# Patient Record
Sex: Male | Born: 1980 | Hispanic: No | Marital: Married | Smoking: Current some day smoker
Health system: Southern US, Community
[De-identification: ages and names within clinical notes are randomized; demographics above are authoritative.]

## PROBLEM LIST (undated history)

## (undated) DIAGNOSIS — S43005A Unspecified dislocation of left shoulder joint, initial encounter: Secondary | ICD-10-CM

## (undated) DIAGNOSIS — J45909 Unspecified asthma, uncomplicated: Secondary | ICD-10-CM

## (undated) DIAGNOSIS — J302 Other seasonal allergic rhinitis: Secondary | ICD-10-CM

## (undated) HISTORY — PX: NO PAST SURGERIES: SHX2092

## (undated) HISTORY — DX: Unspecified dislocation of left shoulder joint, initial encounter: S43.005A

---

## 2001-01-26 HISTORY — PX: WISDOM TOOTH EXTRACTION: SHX21

## 2014-06-19 ENCOUNTER — Encounter: Payer: Self-pay | Admitting: Emergency Medicine

## 2014-06-19 ENCOUNTER — Ambulatory Visit
Admission: EM | Admit: 2014-06-19 | Discharge: 2014-06-19 | Disposition: A | Discharge status: Home / Self Care | Payer: Managed Care, Other (non HMO) | Attending: Family Medicine | Admitting: Family Medicine

## 2014-06-19 DIAGNOSIS — J45901 Unspecified asthma with (acute) exacerbation: Secondary | ICD-10-CM | POA: Diagnosis not present

## 2014-06-19 HISTORY — DX: Unspecified asthma, uncomplicated: J45.909

## 2014-06-19 HISTORY — DX: Other seasonal allergic rhinitis: J30.2

## 2014-06-19 MED ORDER — BECLOMETHASONE DIPROPIONATE 80 MCG/ACT IN AERS: 1.0000 | INHALATION_SPRAY | Freq: Two times a day (BID) | RESPIRATORY_TRACT | Status: DC

## 2014-06-19 MED ORDER — ALBUTEROL SULFATE HFA 108 (90 BASE) MCG/ACT IN AERS: 1.0000 | INHALATION_SPRAY | Freq: Four times a day (QID) | RESPIRATORY_TRACT | Status: DC | PRN

## 2014-06-19 MED ORDER — GUAIFENESIN-CODEINE 100-10 MG/5ML PO SOLN: ORAL | Status: DC

## 2014-06-19 NOTE — Discharge Instructions (Signed)

## 2014-06-19 NOTE — ED Provider Notes (Signed)
CSN: [id number]     Arrival date & time 06/19/14  1929 History   First MD Initiated Contact with Patient 06/19/14 1931     Chief Complaint  Patient presents with  . Asthma   (Consider location/radiation/quality/duration/timing/severity/associated sxs/prior Treatment) HPI Comments: Patient with a h/o mild, intermittent asthma presents with 1 week h/o wheezing, shortness of breath, cough worse since pollen increased and allergies worse. States has been out of his albuterol inhaler since January. Recently moved here and has not established care with a new PCP.   Patient is a 34 y.o. male presenting with asthma. The history is provided by the patient.  Asthma This is a chronic (h/o asthma; worse recently due to pollen) problem. The current episode started more than 1 week ago. The problem occurs constantly. The problem has not changed since onset.   Past Medical History  Diagnosis Date  . Asthma   . Seasonal allergies    No past surgical history on file. No family history on file. History  Substance Use Topics  . Smoking status: Current Some Day Smoker    Types: Cigars  . Smokeless tobacco: Never Used  . Alcohol Use: 1.2 oz/week    2 Cans of beer per week    Review of Systems  Allergies  Review of patient's allergies indicates no known allergies.  Home Medications   Prior to Admission medications   Medication Sig Start Date End Date Taking? Authorizing Provider  albuterol (PROVENTIL HFA;VENTOLIN HFA) 108 (90 BASE) MCG/ACT inhaler Inhale 1-2 puffs into the lungs every 6 (six) hours as needed for wheezing or shortness of breath. 06/19/14   Payton Mccallumrlando Shylin Keizer, MD  beclomethasone (QVAR) 80 MCG/ACT inhaler Inhale 1 puff into the lungs 2 (two) times daily. 06/19/14   Payton Mccallumrlando Nalaya Wojdyla, MD  guaiFENesin-codeine 100-10 MG/5ML syrup Take 10ml po qhs prn cough 06/19/14   Payton Mccallumrlando Kaiea Esselman, MD   BP 131/75 mmHg  Pulse 75  Temp(Src) 98.4 F (36.9 C) (Oral)  Resp 18  Ht 5\' 11"  (1.803 m)  Wt 250 lb  (113.399 kg)  BMI 34.88 kg/m2  SpO2 99% Physical Exam  Constitutional: He appears well-developed and well-nourished. No distress.  HENT:  Head: Normocephalic and atraumatic.  Right Ear: Tympanic membrane, external ear and ear canal normal.  Left Ear: Tympanic membrane, external ear and ear canal normal.  Nose: Nose normal.  Mouth/Throat: Uvula is midline, oropharynx is clear and moist and mucous membranes are normal. No oropharyngeal exudate or tonsillar abscesses.  Eyes: Conjunctivae and EOM are normal. Pupils are equal, round, and reactive to light. Right eye exhibits no discharge. Left eye exhibits no discharge. No scleral icterus.  Neck: Normal range of motion. Neck supple. No tracheal deviation present. No thyromegaly present.  Cardiovascular: Normal rate, regular rhythm and normal heart sounds.   Pulmonary/Chest: Effort normal. No stridor. No respiratory distress. He has wheezes (mild bilateral expiratory wheezes). He has no rales. He exhibits no tenderness.  Lymphadenopathy:    He has no cervical adenopathy.  Neurological: He is alert.  Skin: Skin is warm and dry. No rash noted. He is not diaphoretic.  Nursing note and vitals reviewed.   ED Course  Procedures (including critical care time) Labs Review Labs Reviewed - No data to display  Imaging Review No results found.   MDM   1. Asthma exacerbation   (mild)  Plan: 1. Diagnosis reviewed with patient 2. rx as per orders; risks, benefits, potential side effects reviewed with patient;refilled patient's albuterol inhaler and added/start steroid  inhaler as well. 3. Recommend supportive treatment with otc allergy medications 4. Establish care with PCP 5. F/u prn if symptoms worsen or don't improve    Payton Mccallum, MD 06/19/14 2030

## 2014-06-19 NOTE — ED Notes (Addendum)
Feels the pollen and other allergens are flaring up his asthma. About a week ago started with productive cough, runny nose, vomiting. Has three children at home- one of his boys is sick. Having wheezing and SOB. Subjective fevers a few days ago, but reports that has resolved. Having a pain in his chest as well. Reports he felt like his heart skip a beat. Has a history of acid reflux.

## 2014-07-13 ENCOUNTER — Encounter: Payer: Self-pay | Admitting: Internal Medicine

## 2014-07-13 DIAGNOSIS — F17200 Nicotine dependence, unspecified, uncomplicated: Secondary | ICD-10-CM | POA: Insufficient documentation

## 2014-07-13 DIAGNOSIS — J4530 Mild persistent asthma, uncomplicated: Secondary | ICD-10-CM | POA: Insufficient documentation

## 2014-07-16 ENCOUNTER — Ambulatory Visit: Payer: Self-pay | Admitting: Internal Medicine

## 2014-08-14 ENCOUNTER — Ambulatory Visit: Payer: Self-pay | Admitting: Internal Medicine

## 2017-04-15 ENCOUNTER — Other Ambulatory Visit: Payer: Self-pay

## 2017-04-15 ENCOUNTER — Ambulatory Visit
Admission: EM | Admit: 2017-04-15 | Discharge: 2017-04-15 | Disposition: A | Discharge status: Home / Self Care | Payer: Managed Care, Other (non HMO) | Attending: Family Medicine | Admitting: Family Medicine

## 2017-04-15 DIAGNOSIS — H6692 Otitis media, unspecified, left ear: Secondary | ICD-10-CM | POA: Diagnosis not present

## 2017-04-15 DIAGNOSIS — J988 Other specified respiratory disorders: Secondary | ICD-10-CM

## 2017-04-15 LAB — RAPID STREP SCREEN (MED CTR MEBANE ONLY): Streptococcus, Group A Screen (Direct): NEGATIVE

## 2017-04-15 MED ORDER — GUAIFENESIN-CODEINE 100-10 MG/5ML PO SOLN: ORAL | 0 refills | Status: DC

## 2017-04-15 MED ORDER — AMOXICILLIN-POT CLAVULANATE 875-125 MG PO TABS: 1.0000 | ORAL_TABLET | Freq: Two times a day (BID) | ORAL | 0 refills | Status: DC

## 2017-04-15 MED ORDER — BECLOMETHASONE DIPROPIONATE 80 MCG/ACT IN AERS: 1.0000 | INHALATION_SPRAY | Freq: Two times a day (BID) | RESPIRATORY_TRACT | 1 refills | Status: DC

## 2017-04-15 NOTE — Discharge Instructions (Signed)
Medications as prescribed. ° °Take care ° °Dr. Yuriel Lopezmartinez  °

## 2017-04-15 NOTE — ED Provider Notes (Signed)
[facility]    CSN: [id number] Arrival date & time: 04/15/17  1229   History   Chief Complaint Chief Complaint  Patient presents with  . Sore Throat   HPI  37 year old male presents with upper respiratory symptoms.  Patient reports that he is been sick since Tuesday.  He states he has had sore throat, congestion, ear pain/pressure, cough, wheezing.  He is recently had a sick contact as he took care of his daughter.  He is out of his controller medication for his asthma.  He is been using his albuterol sparingly.  No fever.  No known exacerbating relieving factors.  No other reported symptoms.  No other complaints at this time.  Past Medical History:  Diagnosis Date  . Asthma   . Seasonal allergies    Patient Active Problem List   Diagnosis Date Noted  . Mild persistent asthma in adult without complication 07/13/2014  . Tobacco use disorder 07/13/2014    Past Surgical History:  Procedure Laterality Date  . NO PAST SURGERIES      Home Medications    Prior to Admission medications   Medication Sig Start Date End Date Taking? Authorizing Provider  albuterol (PROVENTIL HFA;VENTOLIN HFA) 108 (90 BASE) MCG/ACT inhaler Inhale 1-2 puffs into the lungs every 6 (six) hours as needed for wheezing or shortness of breath. 06/19/14  Yes Payton Mccallumonty, [place], MD  amoxicillin-clavulanate (AUGMENTIN) 875-125 MG tablet Take 1 tablet by mouth every 12 (twelve) hours. 04/15/17   Tommie Samsook, Fed Ceci G, DO  beclomethasone (QVAR) 80 MCG/ACT inhaler Inhale 1 puff into the lungs 2 (two) times daily. 04/15/17   Tommie Samsook, Tanaysia Bhardwaj G, DO  guaiFENesin-codeine 100-10 MG/5ML syrup Take 10ml po qhs prn cough 04/15/17   Tommie Samsook, Travaris Kosh G, DO    Family History Family History  Problem Relation Age of Onset  . Diabetes Mother   . Diabetes Father     Social History Social History   Tobacco Use  . Smoking status: Current Some Day Smoker    Types: Cigars  . Smokeless tobacco: Never Used  Substance Use Topics  .  Alcohol use: Yes    Alcohol/week: 1.2 oz    Types: 2 Cans of beer per week  . Drug use: No   Allergies   Patient has no known allergies.   Review of Systems Review of Systems  Constitutional: Negative for fever.  HENT: Positive for congestion, ear pain and sore throat.   Respiratory: Positive for cough.    Physical Exam Triage Vital Signs ED Triage Vitals  Enc Vitals Group     BP 04/15/17 1244 (!) 161/93     Pulse Rate 04/15/17 1244 87     Resp 04/15/17 1244 18     Temp 04/15/17 1244 99.2 F (37.3 C)     Temp Source 04/15/17 1244 Oral     SpO2 04/15/17 1244 97 %     Weight 04/15/17 1245 250 lb (113.4 kg)     Height 04/15/17 1245 5\' 11"  (1.803 m)     Head Circumference --      Peak Flow --      Pain Score 04/15/17 1243 8     Pain Loc --      Pain Edu? --      Excl. in GC? --    Updated Vital Signs BP (!) 161/93 (BP Location: Left Arm)   Pulse 87   Temp 99.2 F (37.3 C) (Oral)   Resp 18   Ht 5\' 11"  (1.803  m)   Wt 250 lb (113.4 kg)   SpO2 97%   BMI 34.87 kg/m    Physical Exam  Constitutional: He is oriented to person, place, and time. He appears well-developed. No distress.  HENT:  Head: Normocephalic and atraumatic.  Oropharynx with moderate erythema. Left tonsillar swelling. No exudate.  Left ear - moderate erythema.  Eyes: Conjunctivae are normal. Right eye exhibits no discharge. Left eye exhibits no discharge.  Cardiovascular: Normal rate and regular rhythm.  Pulmonary/Chest: Effort normal. He has wheezes.  Neurological: He is alert and oriented to person, place, and time.  Psychiatric: He has a normal mood and affect. His behavior is normal.  Nursing note and vitals reviewed.  UC Treatments / Results  Labs (all labs ordered are listed, but only abnormal results are displayed) Labs Reviewed  RAPID STREP SCREEN (NOT AT [facility])  CULTURE, GROUP A STREP [facility])    EKG  EKG Interpretation None       Radiology No results  found.  Procedures Procedures (including critical care time)  Medications Ordered in UC Medications - No data to display   Initial Impression / Assessment and Plan / UC Course  I have reviewed the triage vital signs and the nursing notes.  Pertinent labs & imaging results that were available during my care of the patient were reviewed by me and considered in my medical decision making (see chart for details).    37 year old male presents with a respiratory infection.  Found to have otitis media.  Treating with Augmentin.  Refilled Qvar.  Guaifenesin with codeine for cough.  Final Clinical Impressions(s) / UC Diagnoses   Final diagnoses:  Respiratory infection  Left otitis media, unspecified otitis media type    ED Discharge Orders        Ordered    guaiFENesin-codeine 100-10 MG/5ML syrup     04/15/17 1314    beclomethasone (QVAR) 80 MCG/ACT inhaler  2 times daily     04/15/17 1314    amoxicillin-clavulanate (AUGMENTIN) 875-125 MG tablet  Every 12 hours     04/15/17 1314     Controlled Substance Prescriptions [place] Controlled Substance Registry consulted? Not Applicable   Tommie Sams, DO 04/15/17 1335

## 2017-04-15 NOTE — ED Triage Notes (Signed)
Patient complains of sore throat that started Monday evening. Patient states that when he woke up on Tuesday symptoms were worse. Patient states that he has been noticing nasal congestion. Patient reports history of asthma. Patient states that he has been using inhaler sparingly. Left ear pain and headache on Wednesday and felt a pop in his ear.

## 2017-04-18 LAB — CULTURE, GROUP A STREP (THRC)

## 2019-01-27 HISTORY — PX: WISDOM TOOTH EXTRACTION: SHX21

## 2020-01-04 ENCOUNTER — Ambulatory Visit (INDEPENDENT_AMBULATORY_CARE_PROVIDER_SITE_OTHER): Payer: 59

## 2020-01-04 ENCOUNTER — Ambulatory Visit
Admission: EM | Admit: 2020-01-04 | Discharge: 2020-01-04 | Disposition: A | Discharge status: Home / Self Care | Payer: 59 | Attending: Sports Medicine | Admitting: Sports Medicine

## 2020-01-04 ENCOUNTER — Other Ambulatory Visit: Payer: Self-pay

## 2020-01-04 DIAGNOSIS — M25512 Pain in left shoulder: Secondary | ICD-10-CM

## 2020-01-04 DIAGNOSIS — M62838 Other muscle spasm: Secondary | ICD-10-CM | POA: Diagnosis not present

## 2020-01-04 DIAGNOSIS — S161XXA Strain of muscle, fascia and tendon at neck level, initial encounter: Secondary | ICD-10-CM

## 2020-01-04 DIAGNOSIS — M542 Cervicalgia: Secondary | ICD-10-CM | POA: Diagnosis not present

## 2020-01-04 DIAGNOSIS — X500XXA Overexertion from strenuous movement or load, initial encounter: Secondary | ICD-10-CM

## 2020-01-04 MED ORDER — CYCLOBENZAPRINE HCL 10 MG PO TABS: 10.0000 mg | ORAL_TABLET | Freq: Two times a day (BID) | ORAL | 0 refills | Status: AC | PRN

## 2020-01-04 NOTE — Discharge Instructions (Signed)
X-rays are wnl.  Take flexeril as directly as needed.  Do not operate a motor vehicle or heavy equipment when taking flexeril.  Provided a rx for PT - please call to make an appointment.  Consider chiropractic care.

## 2020-01-04 NOTE — ED Provider Notes (Signed)
[facility]    CSN: [id number] Arrival date & time: 01/04/20  1636      History   Chief Complaint Chief Complaint  Patient presents with  . Neck Pain    HPI Hector Mercer is a 39 y.o. male.   39 yo male who presents for evaluation of left sided posterior neck pain that radiates to the left scapula and the left shoulder. He reports twisting awkwardly while work on 01/02/20 and it occurred around 11pm.  He did not work yesterday or today due to pain, decreased ROM and spasm.  Some parasthesia down the left arm which has improved.  No red flag signs or symptoms.     Past Medical History:  Diagnosis Date  . Asthma   . Seasonal allergies     Patient Active Problem List   Diagnosis Date Noted  . Mild persistent asthma in adult without complication 07/13/2014  . Tobacco use disorder 07/13/2014    Past Surgical History:  Procedure Laterality Date  . NO PAST SURGERIES         Home Medications    Prior to Admission medications   Medication Sig Start Date End Date Taking? Authorizing Provider  albuterol (PROVENTIL HFA;VENTOLIN HFA) 108 (90 BASE) MCG/ACT inhaler Inhale 1-2 puffs into the lungs every 6 (six) hours as needed for wheezing or shortness of breath. 06/19/14   Payton Mccallum, MD  amoxicillin-clavulanate (AUGMENTIN) 875-125 MG tablet Take 1 tablet by mouth every 12 (twelve) hours. 04/15/17   Tommie Sams, DO  beclomethasone (QVAR) 80 MCG/ACT inhaler Inhale 1 puff into the lungs 2 (two) times daily. 04/15/17   Tommie Sams, DO  cyclobenzaprine (FLEXERIL) 10 MG tablet Take 1 tablet (10 mg total) by mouth 2 (two) times daily as needed for up to 10 days for muscle spasms. 01/04/20 01/14/20  Delton See, MD  guaiFENesin-codeine 100-10 MG/5ML syrup Take 77ml po qhs prn cough 04/15/17   Tommie Sams, DO    Family History Family History  Problem Relation Age of Onset  . Diabetes Mother   . Diabetes Father     Social History Social History   Tobacco  Use  . Smoking status: Current Some Day Smoker    Types: Cigars  . Smokeless tobacco: Never Used  Vaping Use  . Vaping Use: Every day  Substance Use Topics  . Alcohol use: Yes    Alcohol/week: 2.0 standard drinks    Types: 2 Cans of beer per week  . Drug use: No     Allergies   Patient has no known allergies.   Review of Systems Review of Systems  Musculoskeletal: Positive for neck pain and neck stiffness.  All other systems reviewed and are negative.    Physical Exam Triage Vital Signs ED Triage Vitals  Enc Vitals Group     BP 01/04/20 1718 (!) 144/85     Pulse Rate 01/04/20 1718 75     Resp 01/04/20 1718 18     Temp 01/04/20 1718 98.3 F (36.8 C)     Temp Source 01/04/20 1718 Oral     SpO2 01/04/20 1718 97 %     Weight 01/04/20 1716 225 lb (102.1 kg)     Height 01/04/20 1716 5\' 11"  (1.803 m)     Head Circumference --      Peak Flow --      Pain Score 01/04/20 1711 7     Pain Loc --      Pain Edu? --  Excl. in GC? --    No data found.  Updated Vital Signs BP (!) 144/85 (BP Location: Right Arm)   Pulse 75   Temp 98.3 F (36.8 C) (Oral)   Resp 18   Ht 5\' 11"  (1.803 m)   Wt 102.1 kg   SpO2 97%   BMI 31.38 kg/m   Visual Acuity Right Eye Distance:   Left Eye Distance:   Bilateral Distance:    Right Eye Near:   Left Eye Near:    Bilateral Near:     Physical Exam Vitals and nursing note reviewed.  Constitutional:      Appearance: Normal appearance. He is not ill-appearing or toxic-appearing.  HENT:     Head: Normocephalic and atraumatic.  Neck:     Comments: Decreased ROM in all planes of the neck.  TTP in left trap and into the medial scapular border.  No focal strength deficits.  No weakness.  2+ DTR's.  B/L shoulder exam wnl. Musculoskeletal:        General: Tenderness and signs of injury present.     Cervical back: Rigidity and tenderness present.  Skin:    General: Skin is warm and dry.  Neurological:     General: No focal deficit  present.     Mental Status: He is alert and oriented to person, place, and time.     Motor: No weakness.      UC Treatments / Results  Labs (all labs ordered are listed, but only abnormal results are displayed) Labs Reviewed - No data to display  EKG   Radiology DG Cervical Spine Complete  Result Date: 01/04/2020 CLINICAL DATA:  Neck pain after lifting a heavy palate. Pain radiates to the left shoulder. EXAM: CERVICAL SPINE - COMPLETE 4+ VIEW COMPARISON:  None. FINDINGS: There is no evidence of cervical spine fracture or prevertebral soft tissue swelling. Alignment is normal. No other significant bone abnormalities are identified. IMPRESSION: Negative cervical spine radiographs. Electronically Signed   By: 14/09/2019 M.D.   On: 01/04/2020 18:12    Procedures Procedures (including critical care time)  Medications Ordered in UC Medications - No data to display  Initial Impression / Assessment and Plan / UC Course  I have reviewed the triage vital signs and the nursing notes.  Pertinent labs & imaging results that were available during my care of the patient were reviewed by me and considered in my medical decision making (see chart for details).      Final Clinical Impressions(s) / UC Diagnoses   Final diagnoses:  Neck pain  Strain of neck muscle, initial encounter  Muscle spasms of neck  Trapezius muscle spasm     Discharge Instructions     X-rays are wnl.  Take flexeril as directly as needed.  Do not operate a motor vehicle or heavy equipment when taking flexeril.  Provided a rx for PT - please call to make an appointment.  Consider chiropractic care.    ED Prescriptions    Medication Sig Dispense Auth. Provider   cyclobenzaprine (FLEXERIL) 10 MG tablet Take 1 tablet (10 mg total) by mouth 2 (two) times daily as needed for up to 10 days for muscle spasms. 20 tablet 14/09/2019, MD     PDMP not reviewed this encounter.   Delton See,  MD 01/04/20 14/09/21

## 2020-01-04 NOTE — ED Triage Notes (Signed)
Patient complains of neck pain that started Tuesday evening at 11pm. Patient states that this occurred while working. 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 States that he has limited range of motion in his neck and that pain radiates in to his left shoulder blade.

## 2021-01-31 ENCOUNTER — Ambulatory Visit
Admission: EM | Admit: 2021-01-31 | Discharge: 2021-01-31 | Disposition: A | Discharge status: Other Acute Inpatient Hospital | Payer: 59 | Attending: Physician Assistant | Admitting: Physician Assistant

## 2021-01-31 ENCOUNTER — Ambulatory Visit (INDEPENDENT_AMBULATORY_CARE_PROVIDER_SITE_OTHER): Payer: 59

## 2021-01-31 ENCOUNTER — Encounter: Payer: Self-pay | Admitting: Emergency Medicine

## 2021-01-31 ENCOUNTER — Other Ambulatory Visit: Payer: Self-pay

## 2021-01-31 DIAGNOSIS — R079 Chest pain, unspecified: Secondary | ICD-10-CM

## 2021-01-31 DIAGNOSIS — R229 Localized swelling, mass and lump, unspecified: Secondary | ICD-10-CM

## 2021-01-31 DIAGNOSIS — R0789 Other chest pain: Secondary | ICD-10-CM

## 2021-01-31 MED ORDER — ASPIRIN 81 MG PO CHEW
324.0000 mg | CHEWABLE_TABLET | Freq: Once | ORAL | Status: AC
  Administered 2021-01-31: 324 mg via ORAL

## 2021-01-31 NOTE — ED Provider Notes (Signed)
[facility]    CSN: [id number] Arrival date & time: 01/31/21  0934      History   Chief Complaint Chief Complaint  Patient presents with   Rib Pain    Left side    HPI Hector Mercer is a 41 y.o. male.   HPI  Chest Pain: Patient reports that he has had left-sided rib pain off and on for about 2 weeks.  Usually is occurring with activity or stress.  He states that he has been more stressed recently than normal with a new job, home stress, etc. 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  He states that he took an aspirin and sat down and rested and the symptoms resolved within a few minutes.  He denied any shortness of breath, peripheral edema, fever or cough. No chest injury.  He states that today when he was discussing symptoms with his boss his symptoms returned about an hour ago. He reports that the pain feels sharp like and occurs almost in throbbing sensation. He does report a mass of his chest that he has noticed for the past few weeks which is right over where the pain in his chest is. This area is tender but does not necessarily reproduce his chest pain.  In terms of MI risk he denies any family history of early cardiac death or MI, he is a tobacco user.   Past Medical History:  Diagnosis Date   Asthma    Seasonal allergies     Patient Active Problem List   Diagnosis Date Noted   Mild persistent asthma in adult without complication 07/13/2014   Tobacco use disorder 07/13/2014    Past Surgical History:  Procedure Laterality Date   NO PAST SURGERIES         Home Medications    Prior to Admission medications   Medication Sig Start Date End Date Taking? Authorizing Provider  albuterol (PROVENTIL HFA;VENTOLIN HFA) 108 (90 BASE) MCG/ACT inhaler Inhale 1-2 puffs into the lungs every 6 (six) hours as needed for wheezing or shortness of breath. 06/19/14   Payton Mccallumonty,  [place], MD  amoxicillin-clavulanate (AUGMENTIN) 875-125 MG tablet Take 1 tablet by mouth every 12 (twelve) hours. 04/15/17   Tommie Samsook, Jayce G, DO  beclomethasone (QVAR) 80 MCG/ACT inhaler Inhale 1 puff into the lungs 2 (two) times daily. 04/15/17   Tommie Samsook, Jayce G, DO  guaiFENesin-codeine 100-10 MG/5ML syrup Take 10ml po qhs prn cough 04/15/17   Tommie Samsook, Jayce G, DO    Family History Family History  Problem Relation Age of Onset   Diabetes Mother    Diabetes Father     Social History Social History   Tobacco Use   Smoking status: Some Days    Types: Cigars   Smokeless tobacco: Never  Vaping Use   Vaping Use: Every day  Substance Use Topics   Alcohol use: Yes    Alcohol/week: 2.0 standard drinks    Types: 2 Cans of beer per week   Drug use: No     Allergies   Patient has no known allergies.   Review of Systems Review of Systems  As stated above in HPI Physical Exam Triage Vital Signs ED Triage Vitals  Enc Vitals Group     BP 01/31/21 0949 (!) 150/94     Pulse Rate 01/31/21 0949 93     Resp 01/31/21 0949 15  Temp 01/31/21 0949 98.4 F (36.9 C)     Temp Source 01/31/21 0949 Oral     SpO2 01/31/21 0949 100 %     Weight 01/31/21 0946 225 lb 1.4 oz (102.1 kg)     Height 01/31/21 0946 5\' 11"  (1.803 m)     Head Circumference --      Peak Flow --      Pain Score 01/31/21 0946 6     Pain Loc --      Pain Edu? --      Excl. in GC? --    No data found.  Updated Vital Signs BP (!) 150/94 (BP Location: Left Arm)    Pulse 93    Temp 98.4 F (36.9 C) (Oral)    Resp 15    Ht 5\' 11"  (1.803 m)    Wt 225 lb 1.4 oz (102.1 kg)    SpO2 100%    BMI 31.39 kg/m   Physical Exam Vitals and nursing note reviewed.  Constitutional:      General: He is not in acute distress.    Appearance: Normal appearance. He is not ill-appearing, toxic-appearing or diaphoretic.  HENT:     Head: Normocephalic and atraumatic.  Eyes:     Extraocular Movements: Extraocular movements intact.      Conjunctiva/sclera: Conjunctivae normal.     Pupils: Pupils are equal, round, and reactive to light.  Neck:     Vascular: No carotid bruit.  Cardiovascular:     Rate and Rhythm: Normal rate and regular rhythm.     Heart sounds: Normal heart sounds.     Comments: There is a smooth mobile oval soft 1 inch long mass of the left lower chest near PMI. No reproducible tenderness of chest to palpation.  Pulmonary:     Effort: Pulmonary effort is normal.     Breath sounds: Normal breath sounds.  Skin:    General: Skin is warm.     Capillary Refill: Capillary refill takes less than 2 seconds.     Coloration: Skin is not pale.     Findings: No bruising or rash.  Neurological:     Mental Status: He is alert and oriented to person, place, and time.     UC Treatments / Results  Labs (all labs ordered are listed, but only abnormal results are displayed) Labs Reviewed - No data to display  EKG   Radiology No results found.  Procedures Procedures (including critical care time)  Medications Ordered in UC Medications - No data to display  Initial Impression / Assessment and Plan / UC Course  I have reviewed the triage vital signs and the nursing notes.  Pertinent labs & imaging results that were available during my care of the patient were reviewed by me and considered in my medical decision making (see chart for details).     New. Has occurred off and on for 2 weeks. EKG shows a RBB which is new. Suggests a possible inferior MI however on review this is questionable. For now we have decided to administer ASA since he has not had any this morning and obtain a quick chest x ray. If cardiac etiology still suspected after this his wife will transport him to the ER per patient request. He appears stable with stable vitals and 6/10 pain.  Final Clinical Impressions(s) / UC Diagnoses   Final diagnoses:  None   Discharge Instructions   None    ED Prescriptions   None  PDMP not  reviewed this encounter.   Rushie Chestnut, [place] 01/31/21 1048

## 2021-01-31 NOTE — Discharge Instructions (Addendum)
Please contact general surgery to evaluate your skin mass after your ER visit

## 2021-01-31 NOTE — ED Triage Notes (Signed)
Patient states that yesterday he was yelling at his son and developed pain on the left side of his rib cage.  Patient denies SOB at this time.  Patient denies pain this morning but when he told his boss at work about what happened his boos told him to come here to get checked out.  Patient denies any cold symptoms.

## 2021-04-04 ENCOUNTER — Other Ambulatory Visit: Payer: Self-pay

## 2021-04-04 ENCOUNTER — Encounter: Payer: Self-pay | Admitting: Family Medicine

## 2021-04-04 ENCOUNTER — Ambulatory Visit (INDEPENDENT_AMBULATORY_CARE_PROVIDER_SITE_OTHER): Payer: BC Managed Care – PPO | Admitting: Family Medicine

## 2021-04-04 VITALS — BP 118/78 | HR 84 | Ht 71.0 in | Wt 233.0 lb

## 2021-04-04 DIAGNOSIS — R079 Chest pain, unspecified: Secondary | ICD-10-CM | POA: Diagnosis not present

## 2021-04-04 DIAGNOSIS — R0789 Other chest pain: Secondary | ICD-10-CM | POA: Diagnosis not present

## 2021-04-04 HISTORY — DX: Other chest pain: R07.89

## 2021-04-04 MED ORDER — HYDROXYZINE PAMOATE 25 MG PO CAPS: 25.0000 mg | ORAL_CAPSULE | Freq: Three times a day (TID) | ORAL | 0 refills | Status: AC | PRN

## 2021-04-04 NOTE — Progress Notes (Signed)
?  ? ?Primary Care / Sports Medicine Office Visit ? ?Patient Information:  ?Patient ID: Hector Mercer, male DOB: [birth year] Age: 41 y.o. MRN: [id number]  ? ?Hector Mercer is a pleasant 41 y.o. male presenting with the following: ? ?Chief Complaint  ?Patient presents with  ? New Patient (Initial Visit)  ? Establish Care  ? Chest Pain  ?  left-sided rib pain off and on since end 12/2020 / early 01/2021.  Usually is occurring with activity or stress/anxiety.  He states that he has been more stressed recently than normal with a new job, home stress, etc..Saw urgent care, advised follow-up.  ? ? ?Vitals:  ? 04/04/21 1503  ?BP: 118/78  ?Pulse: 84  ?SpO2: 97%  ? ?Vitals:  ? 04/04/21 1503  ?Weight: 233 lb (105.7 kg)  ?Height: 5\' 11"  (1.803 m)  ? ?Body mass index is 32.5 kg/m?. ? ?No results found.  ? ?Independent interpretation of notes and tests performed by another provider:  ? ?Independent interpretation of chest x-ray dated 01/31/2021 reveals clear lung fields bilaterally, normal cardiac silhouette, no acute cardiopulmonary process noted ? ?Procedures performed:  ? ?None ? ?Pertinent History, Exam, Impression, and Recommendations:  ? ?Intermittent left-sided chest pain ?41 year old patient presenting with 2-55-month history of intermittent left-sided chest pain, noted with physical exertion and during stressful states.  He denies any similar symptomatology in the past.  He does endorse increased life/work stress as he has restarted a physically demanding job lately.  He did go to the urgent care with this issue on 01/31/2021 where EKG and chest x-ray obtained.  Chest x-ray normal, EKG read as sinus arrhythmia with questionable right bundle branch block.  He does have a family history of maternal hypertension/heart failure. ? ?Examination today reveals positive S1 and S2, regular rate and rhythm, no additional heart sounds, no JVD, no carotid bruits, symmetric pulses, no peripheral edema.  From pulmonary standpoint, clear  lung fields throughout with initial coarse breath sounds noted in the left lower field that cleared after repetitive coughing.  Nontender about the sternum and parasternal regions, he does have a well-circumscribed left anterior inferior costal mass most consistent with a ganglion, this area is mildly tender but he states that this does not recreate his stated symptomatology.  An EKG was obtained today revealing apparent right bundle branch block in V1 and V2. ? ?I have discussed the varying etiologies behind his stated symptoms including cardiac, pulmonary, gastrointestinal, psychological, and musculoskeletal.  At this stage I have placed a referral to cardiology for further evaluation including possible echocardiogram and Holter monitor, I will obtain risk stratification labs, he can trial ibuprofen for symptoms to assess response from a musculoskeletal standpoint, additionally as needed hydroxyzine to assess anxiety component.  Plan for close follow-up after the patient has seen cardiology for next steps, he was advised to contact 03/31/2021 or seek emergent evaluation for recurrent symptomatology. ? ?Acute issue, systemic findings, tenderness, Rx management, independent interpretation radiography  ? ?Orders & Medications ?Meds ordered this encounter  ?Medications  ? hydrOXYzine (VISTARIL) 25 MG capsule  ?  Sig: Take 1 capsule (25 mg total) by mouth every 8 (eight) hours as needed for anxiety.  ?  Dispense:  30 capsule  ?  Refill:  0  ? ?Orders Placed This Encounter  ?Procedures  ? Apo A1 + B + Ratio  ? CBC  ? Comprehensive metabolic panel  ? Lipid panel  ? TSH  ? Ambulatory referral to Cardiology  ? EKG 12-Lead  ?  ? ?  Return in about 2 months (around 06/04/2021).  ?  ? ?Jerrol Banana, MD ? ? Primary Care Sports Medicine ?[facility] ?[facility]  ? ?

## 2021-04-04 NOTE — Assessment & Plan Note (Signed)
41 year old patient presenting with 2-51-month history of intermittent left-sided chest pain, noted with physical exertion and during stressful states.  He denies any similar symptomatology in the past.  He does endorse increased life/work stress as he has restarted a physically demanding job lately.  He did go to the urgent care with this issue on 01/31/2021 where EKG and chest x-ray obtained.  Chest x-ray normal, EKG read as sinus arrhythmia with questionable right bundle branch block.  He does have a family history of maternal hypertension/heart failure. ? ?Examination today reveals positive S1 and S2, regular rate and rhythm, no additional heart sounds, no JVD, no carotid bruits, symmetric pulses, no peripheral edema.  From pulmonary standpoint, clear lung fields throughout with initial coarse breath sounds noted in the left lower field that cleared after repetitive coughing.  Nontender about the sternum and parasternal regions, he does have a well-circumscribed left anterior inferior costal mass most consistent with a ganglion, this area is mildly tender but he states that this does not recreate his stated symptomatology.  An EKG was obtained today revealing apparent right bundle branch block in V1 and V2. ? ?I have discussed the varying etiologies behind his stated symptoms including cardiac, pulmonary, gastrointestinal, psychological, and musculoskeletal.  At this stage I have placed a referral to cardiology for further evaluation including possible echocardiogram and Holter monitor, I will obtain risk stratification labs, he can trial ibuprofen for symptoms to assess response from a musculoskeletal standpoint, additionally as needed hydroxyzine to assess anxiety component.  Plan for close follow-up after the patient has seen cardiology for next steps, he was advised to contact us or seek emergent evaluation for recurrent symptomatology. ? ?Acute issue, systemic findings, tenderness, Rx management, independent  interpretation radiography ?

## 2021-04-04 NOTE — Patient Instructions (Addendum)
-   Obtain fasting labs with orders provided (can have water or black coffee but otherwise no food or drink x 8 hours before labs) ?- Review information provided ?-Referral coordinator will contact you to schedule follow-up with cardiology ?- Can trial hydroxyzine if chest pain occurs, additionally ibuprofen, make note if beneficial ?- If symptoms recur, seek emergent medical evaluation ?- Return for follow-up in 2 months ?

## 2021-05-23 ENCOUNTER — Encounter: Payer: Self-pay | Admitting: Cardiology

## 2021-05-23 ENCOUNTER — Ambulatory Visit (INDEPENDENT_AMBULATORY_CARE_PROVIDER_SITE_OTHER): Payer: BC Managed Care – PPO | Admitting: Cardiology

## 2021-05-23 VITALS — BP 132/80 | HR 113 | Ht 71.5 in | Wt 234.0 lb

## 2021-05-23 DIAGNOSIS — F17200 Nicotine dependence, unspecified, uncomplicated: Secondary | ICD-10-CM

## 2021-05-23 DIAGNOSIS — R072 Precordial pain: Secondary | ICD-10-CM | POA: Diagnosis not present

## 2021-05-23 NOTE — Patient Instructions (Signed)
Medication Instructions:  ? ?Your physician recommends that you continue on your current medications as directed. Please refer to the Current Medication list given to you today. ? ?*If you need a refill on your cardiac medications before your next appointment, please call your pharmacy* ? ? ?Lab Work: ? ?None ordered ? ?If you have labs (blood work) drawn today and your tests are completely normal, you will receive your results only by: ?MyChart Message (if you have MyChart) OR ?A paper copy in the mail ?If you have any lab test that is abnormal or we need to change your treatment, we will call you to review the results. ? ? ?Testing/Procedures: ? ?Your physician has requested that you have an echocardiogram. Echocardiography is a painless test that uses sound waves to create images of your heart. It provides your doctor with information about the size and shape of your heart and how well your heart?s chambers and valves are working. This procedure takes approximately one hour. There are no restrictions for this procedure. ? ? ? ?Follow-Up: ?At [facility], you and your health needs are our priority.  As part of our continuing mission to provide you with exceptional heart care, we have created designated Provider Care Teams.  These Care Teams include your primary Cardiologist (physician) and Advanced Practice Providers (APPs -  Physician Assistants and Nurse Practitioners) who all work together to provide you with the care you need, when you need it. ? ?We recommend signing up for the patient portal called "MyChart".  Sign up information is provided on this After Visit Summary.  MyChart is used to connect with patients for Virtual Visits (Telemedicine).  Patients are able to view lab/test results, encounter notes, upcoming appointments, etc.  Non-urgent messages can be sent to your provider as well.   ?To learn more about what you can do with MyChart, go to [email or web address].   ? ?Your next appointment:    ?Follow up after Echo  ? ?The format for your next appointment:   ?In Person ? ?Provider:   ?You may see Brian Agbor-Etang, MD or one of the following Advanced Practice Providers on your designated Care Team:   ?Christopher Berge, NP ?Ryan Dunn, PA-C ?Cadence Furth, PA-C  ? ? ?Other Instructions ? ? ?Important Information About Sugar ? ? ? ? ? ? ?

## 2021-05-23 NOTE — Progress Notes (Signed)
?Cardiology Office Note:   ? ?Date:  05/23/2021  ? ?ID:  Hector Mercer, DOB [birth year], MRN [id number] ? ?PCP:  Jerrol Banana, MD ?  ?[facility] Providers ?Cardiologist:  None    ? ?Referring MD: Jerrol Banana, MD  ? ?Chief Complaint  ?Patient presents with  ? Other  ? New Patient (Initial Visit)  ?  Referred by PCP for chest pain. Meds reviewed verbally with patient.   ? ?Hector Mercer is a 41 y.o. male who is being seen today for the evaluation of chest pain at the request of Jerrol Banana, MD. ? ? ?History of Present Illness:   ? ?Hector Mercer is a 41 y.o. male with a hx of asthma, current smoker x15 years who presents due to chest pain ? ?Patient had an episode of chest pain about 2 months ago which she thinks was from lifting boxes.  He followed up with primary care provider about 2 months later, an EKG was obtained with nonspecific T wave changes.  He denies any further episodes of chest pain.  Denies shortness of breath, palpitations.  Denies any history of heart disease. ? ?Past Medical History:  ?Diagnosis Date  ? Asthma   ? Seasonal allergies   ? Shoulder dislocation, left, initial encounter   ? ? ?Past Surgical History:  ?Procedure Laterality Date  ? WISDOM TOOTH EXTRACTION Bilateral 2003  ? WISDOM TOOTH EXTRACTION Bilateral 2021  ? ? ?Current Medications: ?Current Meds  ?Medication Sig  ? hydrOXYzine (VISTARIL) 25 MG capsule Take 1 capsule (25 mg total) by mouth every 8 (eight) hours as needed for anxiety.  ?  ? ?Allergies:   Patient has no known allergies.  ? ?Social History  ? ?Socioeconomic History  ? Marital status: Married  ?  Spouse name: Hafiz Solomons  ? Number of children: 8  ? Years of education: 12+  ? Highest education level: Some college, no degree  ?Occupational History  ? Not on file  ?Tobacco Use  ? Smoking status: Some Days  ?  Types: Cigars  ? Smokeless tobacco: Former  ?  Types: Chew  ?Vaping Use  ? Vaping Use: Every day  ? Substances: Nicotine, Flavoring  ?Substance and  Sexual Activity  ? Alcohol use: Yes  ?  Alcohol/week: 20.0 standard drinks  ?  Types: 20 Cans of beer per week  ? Drug use: Not Currently  ?  Comment: CBD  ? Sexual activity: Yes  ?  Partners: Female  ?Other Topics Concern  ? Not on file  ?Social History Narrative  ? Not on file  ? ?Social Determinants of Health  ? ?Financial Resource Strain: Not on file  ?Food Insecurity: Not on file  ?Transportation Needs: Not on file  ?Physical Activity: Not on file  ?Stress: Not on file  ?Social Connections: Not on file  ?  ? ?Family History: ?The patient's family history includes Dementia in his maternal grandmother; Diabetes in his father and mother; Heart failure in his mother; Hypertension in his mother; Kidney disease in his mother; Lung cancer in his mother; Prostate cancer in his paternal uncle. ? ?ROS:   ?Please see the history of present illness.    ? All other systems reviewed and are negative. ? ?EKGs/Labs/Other Studies Reviewed:   ? ?The following studies were reviewed today: ? ? ?EKG:  EKG is ordered today.  The ekg ordered today demonstrates sinus tachycardia, cannot rule out anterior infarct ? ?Recent Labs: ?No results found for requested labs within  last 8760 hours.  ?Recent Lipid Panel ?No results found for: CHOL, TRIG, HDL, CHOLHDL, VLDL, LDLCALC, LDLDIRECT ? ? ?Risk Assessment/Calculations:   ? ? ?    ? ?Physical Exam:   ? ?VS:  BP 132/80 (BP Location: Left Arm, Patient Position: Sitting, Cuff Size: Large)   Pulse (!) 113   Ht 5' 11.5" (1.816 m)   Wt 234 lb (106.1 kg)   SpO2 96%   BMI 32.18 kg/m?    ? ?Wt Readings from Last 3 Encounters:  ?05/23/21 234 lb (106.1 kg)  ?04/04/21 233 lb (105.7 kg)  ?01/31/21 225 lb 1.4 oz (102.1 kg)  ?  ? ?GEN:  Well nourished, well developed in no acute distress ?HEENT: Normal ?NECK: No JVD; No carotid bruits ?LYMPHATICS: No lymphadenopathy ?CARDIAC: RRR, no murmurs, rubs, gallops ?RESPIRATORY:  Clear to auscultation without rales, wheezing or rhonchi  ?ABDOMEN: Soft,  non-tender, non-distended ?MUSCULOSKELETAL:  No edema; No deformity  ?SKIN: Warm and dry ?NEUROLOGIC:  Alert and oriented x 3 ?PSYCHIATRIC:  Normal affect  ? ?ASSESSMENT:   ? ?1. Precordial pain   ?2. Smoking   ? ?PLAN:   ? ?In order of problems listed above: ? ?Chest pain, risk factors smoking.  Symptoms alcohol months, not consistent with angina, appears atypical, has not had any symptoms over the past 2 months.  We will get echocardiogram to evaluate any structural abnormalities due to EKG showing possible old anterior infarct.  If symptoms persist or echocardiogram is abnormal, we will plan further testing. ?Current smoker, cessation advised. ? ?Follow-up as needed based on echo results and symptoms. ? ?   ? ? ?Medication Adjustments/Labs and Tests Ordered: ?Current medicines are reviewed at length with the patient today.  Concerns regarding medicines are outlined above.  ?Orders Placed This Encounter  ?Procedures  ? EKG 12-Lead  ? ECHOCARDIOGRAM COMPLETE  ? ?No orders of the defined types were placed in this encounter. ? ? ?Patient Instructions  ?Medication Instructions:  ? ?Your physician recommends that you continue on your current medications as directed. Please refer to the Current Medication list given to you today. ? ? ?*If you need a refill on your cardiac medications before your next appointment, please call your pharmacy* ? ? ?Lab Work: ?None ordered ? ?If you have labs (blood work) drawn today and your tests are completely normal, you will receive your results only by: ?MyChart Message (if you have MyChart) OR ?A paper copy in the mail ?If you have any lab test that is abnormal or we need to change your treatment, we will call you to review the results. ? ? ?Testing/Procedures: ? ?Your physician has requested that you have an echocardiogram. Echocardiography is a painless test that uses sound waves to create images of your heart. It provides your doctor with information about the size and shape of  your heart and how well your heart?s chambers and valves are working. This procedure takes approximately one hour. There are no restrictions for this procedure. ? ? ? ?Follow-Up: ?At [facility], you and your health needs are our priority.  As part of our continuing mission to provide you with exceptional heart care, we have created designated Provider Care Teams.  These Care Teams include your primary Cardiologist (physician) and Advanced Practice Providers (APPs -  Physician Assistants and Nurse Practitioners) who all work together to provide you with the care you need, when you need it. ? ?We recommend signing up for the patient portal called "MyChart".  Sign up information  is provided on this After Visit Summary.  MyChart is used to connect with patients for Virtual Visits (Telemedicine).  Patients are able to view lab/test results, encounter notes, upcoming appointments, etc.  Non-urgent messages can be sent to your provider as well.   ?To learn more about what you can do with MyChart, go to [email or web address].   ? ?Your next appointment:   ?Follow up after Echo  ? ?The format for your next appointment:   ?In Person ? ?Provider:   ?You may see Kate Sable, MD or one of the following Advanced Practice Providers on your designated Care Team:   ?Murray Hodgkins, NP ?Christell Faith, PA-C ?Cadence Kathlen Mody, PA-C  ? ? ?Other Instructions ? ? ?Important Information About Sugar ? ? ? ? ? ?  ? ?Signed, ?Kate Sable, MD  ?05/23/2021 5:04 PM    ?[place] ?

## 2021-06-05 ENCOUNTER — Ambulatory Visit: Payer: BC Managed Care – PPO | Admitting: Family Medicine

## 2021-06-13 ENCOUNTER — Other Ambulatory Visit: Payer: BC Managed Care – PPO

## 2021-06-20 ENCOUNTER — Ambulatory Visit: Payer: BC Managed Care – PPO | Admitting: Medical

## 2021-07-17 ENCOUNTER — Other Ambulatory Visit: Payer: BC Managed Care – PPO

## 2021-07-23 ENCOUNTER — Ambulatory Visit (INDEPENDENT_AMBULATORY_CARE_PROVIDER_SITE_OTHER): Payer: BC Managed Care – PPO

## 2021-07-23 DIAGNOSIS — R072 Precordial pain: Secondary | ICD-10-CM

## 2021-07-23 LAB — ECHOCARDIOGRAM COMPLETE
Area-P 1/2: 5.23 cm2
S' Lateral: 2.8 cm

## 2021-07-28 ENCOUNTER — Ambulatory Visit: Payer: BC Managed Care – PPO | Admitting: Medical

## 2021-07-28 NOTE — Progress Notes (Deleted)
Cardiology Office Note:    Date:  07/28/2021   ID:  Hector Mercer, DOB [birth year], MRN [id number]  PCP:  Jerrol Banana, MD  [facility] Cardiologist:  None  [facility] Electrophysiologist:  None   Referring MD: Jerrol Banana, MD   Chief Complaint: Echo follow-up  History of Present Illness:    Hector Mercer is a 41 y.o. male with a hx of asthma, tobacco use who presents for chest pain.   He was last seen 05/23/21 for chest pain follow-up. EKG showed possible old infarct and an echo was ordered.   Echo showed LVEF 55-60%, no WMA, G1DD, no valve disease  Today,   Past Medical History:  Diagnosis Date   Asthma    Seasonal allergies    Shoulder dislocation, left, initial encounter     Past Surgical History:  Procedure Laterality Date   WISDOM TOOTH EXTRACTION Bilateral 2003   WISDOM TOOTH EXTRACTION Bilateral 2021    Current Medications: No outpatient medications have been marked as taking for the 07/28/21 encounter (Appointment) with Fransico Michael, Shalan Neault H, PA-C.     Allergies:   Patient has no known allergies.   Social History   Socioeconomic History   Marital status: Married    Spouse name: Dyrell Tuccillo   Number of children: 8   Years of education: 12+   Highest education level: Some college, no degree  Occupational History   Not on file  Tobacco Use   Smoking status: Some Days    Types: Cigars   Smokeless tobacco: Former    Types: Associate Professor Use: Every day   Substances: Nicotine, Flavoring  Substance and Sexual Activity   Alcohol use: Yes    Alcohol/week: 20.0 standard drinks of alcohol    Types: 20 Cans of beer per week   Drug use: Not Currently    Comment: CBD   Sexual activity: Yes    Partners: Female  Other Topics Concern   Not on file  Social History Narrative   Not on file   Social Determinants of Health   Financial Resource Strain: Not on file  Food Insecurity: Not on file  Transportation Needs: Not on file   Physical Activity: Not on file  Stress: Not on file  Social Connections: Not on file     Family History: The patient's family history includes Dementia in his maternal grandmother; Diabetes in his father and mother; Heart failure in his mother; Hypertension in his mother; Kidney disease in his mother; Lung cancer in his mother; Prostate cancer in his paternal uncle.  ROS:   Please see the history of present illness.     All other systems reviewed and are negative.  EKGs/Labs/Other Studies Reviewed:    The following studies were reviewed today:  Echo 06/2021   1. Left ventricular ejection fraction, by estimation, is 55 to 60%. The  left ventricle has normal function. The left ventricle has no regional  wall motion abnormalities. Left ventricular diastolic parameters are  consistent with Grade I diastolic  dysfunction (impaired relaxation).   2. Right ventricular systolic function is normal. The right ventricular  size is normal.   3. The mitral valve is normal in structure. No evidence of mitral valve  regurgitation.   4. The aortic valve is tricuspid. Aortic valve regurgitation is not  visualized.   EKG:  EKG is *** ordered today.  The ekg ordered today demonstrates ***  Recent Labs: No results found for requested labs  within last 365 days.  Recent Lipid Panel No results found for: "CHOL", "TRIG", "HDL", "CHOLHDL", "VLDL", "LDLCALC", "LDLDIRECT"   Risk Assessment/Calculations:   {Does this patient have ATRIAL FIBRILLATION?:[id number]}   Physical Exam:    VS:  There were no vitals taken for this visit.    Wt Readings from Last 3 Encounters:  05/23/21 234 lb (106.1 kg)  04/04/21 233 lb (105.7 kg)  01/31/21 225 lb 1.4 oz (102.1 kg)     GEN: *** Well nourished, well developed in no acute distress HEENT: Normal NECK: No JVD; No carotid bruits LYMPHATICS: No lymphadenopathy CARDIAC: ***RRR, no murmurs, rubs, gallops RESPIRATORY:  Clear to auscultation without  rales, wheezing or rhonchi  ABDOMEN: Soft, non-tender, non-distended MUSCULOSKELETAL:  No edema; No deformity  SKIN: Warm and dry NEUROLOGIC:  Alert and oriented x 3 PSYCHIATRIC:  Normal affect   ASSESSMENT:    No diagnosis found. PLAN:    In order of problems listed above:  Chest pain  Tobacco use  Disposition: Follow up {follow up:15908} with ***   Shared Decision Making/Informed Consent   {Are you ordering a CV Procedure (e.g. stress test, cath, DCCV, TEE, etc)?   Press F2        :[id number]}    Signed, Kacper Cartlidge David Stall, PA-C  07/28/2021 8:05 AM    [facility]

## 2021-08-06 ENCOUNTER — Encounter: Payer: Self-pay | Admitting: Medical

## 2021-08-06 ENCOUNTER — Ambulatory Visit (INDEPENDENT_AMBULATORY_CARE_PROVIDER_SITE_OTHER): Payer: BC Managed Care – PPO | Admitting: Medical

## 2021-08-06 VITALS — BP 130/90 | HR 71 | Ht 71.0 in | Wt 235.0 lb

## 2021-08-06 DIAGNOSIS — R072 Precordial pain: Secondary | ICD-10-CM

## 2021-08-06 DIAGNOSIS — R030 Elevated blood-pressure reading, without diagnosis of hypertension: Secondary | ICD-10-CM | POA: Diagnosis not present

## 2021-08-06 MED ORDER — ALBUTEROL SULFATE HFA 108 (90 BASE) MCG/ACT IN AERS: 2.0000 | INHALATION_SPRAY | Freq: Four times a day (QID) | RESPIRATORY_TRACT | 0 refills | Status: AC | PRN

## 2021-08-06 NOTE — Progress Notes (Signed)
Cardiology Office Note:    Date:  08/06/2021   ID:  Hector Mercer, DOB Jul 15, 1980, MRN [id number]  PCP:  Hector Banana, MD  [facility] Cardiologist:  None  [facility] Electrophysiologist:  None   Referring MD: Hector Banana, MD   Chief Complaint: follow-up chest pain  History of Present Illness:    Hector Mercer is a 41 y.o. male with a hx of asthma, current smoker x 15 years who presents for chest pain.   The patient was seen 05/23/21 for chest pain after lifting boxes. EKG with possible old infarct. Echo was ordered.  Echo showed LVEF 55-60%, no WMA, G1DD, normal RVSF.  Today, echo was reviewed in detail. The chest pain he had was mostly MSK, however he did have chest pain prior to this. This occurred in a stressful conversation. Patient denies further chest pain. Brief MSK pain with coughing. BP today is higher than normal. HR is 102bpm. It was high on the last EKG. He is requesting an albuterol inhaler, he does not have a PCP yet, so we will give him 1 albuterol inhaler for his asthma. Initial HR was 102bpm, however EKG showed NSR with rates in the 70s.   Past Medical History:  Diagnosis Date   Asthma    Seasonal allergies    Shoulder dislocation, left, initial encounter     Past Surgical History:  Procedure Laterality Date   WISDOM TOOTH EXTRACTION Bilateral 2003   WISDOM TOOTH EXTRACTION Bilateral 2021    Current Medications: Current Meds  Medication Sig   albuterol (VENTOLIN HFA) 108 (90 Base) MCG/ACT inhaler Inhale 2 puffs into the lungs every 6 (six) hours as needed for wheezing or shortness of breath.   hydrOXYzine (VISTARIL) 25 MG capsule Take 1 capsule (25 mg total) by mouth every 8 (eight) hours as needed for anxiety.     Allergies:   Patient has no known allergies.   Social History   Socioeconomic History   Marital status: Married    Spouse name: Hector Mercer   Number of children: 8   Years of education: 12+   Highest education level:  Some college, no degree  Occupational History   Not on file  Tobacco Use   Smoking status: Some Days    Types: Cigars   Smokeless tobacco: Former    Types: Associate Professor Use: Every day   Substances: Nicotine, Flavoring  Substance and Sexual Activity   Alcohol use: Yes    Alcohol/week: 20.0 standard drinks of alcohol    Types: 20 Cans of beer per week   Drug use: Not Currently    Comment: CBD   Sexual activity: Yes    Partners: Female  Other Topics Concern   Not on file  Social History Narrative   Not on file   Social Determinants of Health   Financial Resource Strain: Not on file  Food Insecurity: Not on file  Transportation Needs: Not on file  Physical Activity: Not on file  Stress: Not on file  Social Connections: Not on file     Family History: The patient's family history includes Dementia in his maternal grandmother; Diabetes in his father and mother; Heart failure in his mother; Hypertension in his mother; Kidney disease in his mother; Lung cancer in his mother; Prostate cancer in his paternal uncle.  ROS:   Please see the history of present illness.     All other systems reviewed and are negative.  EKGs/Labs/Other Studies  Reviewed:    The following studies were reviewed today:  Echo 06/2021  1. Left ventricular ejection fraction, by estimation, is 55 to 60%. The  left ventricle has normal function. The left ventricle has no regional  wall motion abnormalities. Left ventricular diastolic parameters are  consistent with Grade I diastolic  dysfunction (impaired relaxation).   2. Right ventricular systolic function is normal. The right ventricular  size is normal.   3. The mitral valve is normal in structure. No evidence of mitral valve  regurgitation.   4. The aortic valve is tricuspid. Aortic valve regurgitation is not  visualized.   EKG:  EKG is ordered today.  NSR 71bpm, LAD, LAFB, TWI III, no changes  Recent Labs: No results found for  requested labs within last 365 days.  Recent Lipid Panel No results found for: "CHOL", "TRIG", "HDL", "CHOLHDL", "VLDL", "LDLCALC", "LDLDIRECT"  Physical Exam:    VS:  BP 130/90   Pulse 71   Ht 5\' 11"  (1.803 m)   Wt 235 lb (106.6 kg)   SpO2 98%   BMI 32.78 kg/m     Wt Readings from Last 3 Encounters:  08/06/21 235 lb (106.6 kg)  05/23/21 234 lb (106.1 kg)  04/04/21 233 lb (105.7 kg)     GEN:  Well nourished, well developed in no acute distress HEENT: Normal NECK: No JVD; No carotid bruits LYMPHATICS: No lymphadenopathy CARDIAC: RRR, no murmurs, rubs, gallops RESPIRATORY:  Clear to auscultation without rales, wheezing or rhonchi  ABDOMEN: Soft, non-tender, non-distended MUSCULOSKELETAL:  No edema; No deformity  SKIN: Warm and dry NEUROLOGIC:  Alert and oriented x 3 PSYCHIATRIC:  Normal affect   ASSESSMENT:    1. Precordial pain   2. Elevated blood pressure reading    PLAN:    In order of problems listed above:  Chest pain Suspect mostly MSK chest pain. He denies further chest pain episodes. Echo showed normal LVEF with G1DD. No further work-up at this time. For any repeat chest pain can consider treadmill stress test.    Elvated BP BP elevated to 149/95, however he says this is high for him. He does not take antihypertensives at baseline. He does not check BP at home. Recommend he check it daily for a week. He does not have a PCP yet, so we will see him back in 6 months.   Disposition: Follow up in 6 month(s) with MD/APP      Signed, Venisa Frampton 06/04/21, PA-C  08/06/2021 3:57 PM    [facility]

## 2021-08-06 NOTE — Patient Instructions (Signed)
Medication Instructions:  Your physician recommends that you continue on your current medications as directed. Please refer to the Current Medication list given to you today.  *If you need a refill on your cardiac medications before your next appointment, please call your pharmacy*   Lab Work: None ordered If you have labs (blood work) drawn today and your tests are completely normal, you will receive your results only by: MyChart Message (if you have MyChart) OR A paper copy in the mail If you have any lab test that is abnormal or we need to change your treatment, we will call you to review the results.   Testing/Procedures: None ordered   Follow-Up: At [facility], you and your health needs are our priority.  As part of our continuing mission to provide you with exceptional heart care, we have created designated Provider Care Teams.  These Care Teams include your primary Cardiologist (physician) and Advanced Practice Providers (APPs -  Physician Assistants and Nurse Practitioners) who all work together to provide you with the care you need, when you need it.  We recommend signing up for the patient portal called "MyChart".  Sign up information is provided on this After Visit Summary.  MyChart is used to connect with patients for Virtual Visits (Telemedicine).  Patients are able to view lab/test results, encounter notes, upcoming appointments, etc.  Non-urgent messages can be sent to your provider as well.   To learn more about what you can do with MyChart, go to [email or web address].    Your next appointment:   Your physician wants you to follow-up in: 6 months You will receive a reminder letter in the mail two months in advance. If you don't receive a letter, please call our office to schedule the follow-up appointment.   The format for your next appointment:   In Person  Provider:   You may see Debbe Odea, MD or one of the following Advanced Practice Providers on your  designated Care Team:   Nicolasa Ducking, NP Eula Listen, PA-C Cadence Fransico Michael, [place]   Other Instructions N/A  Important Information About Sugar

## 2022-06-26 ENCOUNTER — Ambulatory Visit: Payer: BC Managed Care – PPO | Admitting: Cardiology

## 2022-08-14 ENCOUNTER — Ambulatory Visit: Payer: BC Managed Care – PPO | Admitting: Cardiology

## 2022-10-16 ENCOUNTER — Ambulatory Visit: Payer: BC Managed Care – PPO | Admitting: Cardiology

## 2022-11-27 ENCOUNTER — Ambulatory Visit: Payer: BC Managed Care – PPO | Attending: Cardiology | Admitting: Cardiology

## 2022-11-27 ENCOUNTER — Encounter: Payer: Self-pay | Admitting: Cardiology

## 2022-11-27 VITALS — BP 110/78 | HR 73 | Ht 71.0 in | Wt 219.0 lb

## 2022-11-27 DIAGNOSIS — R072 Precordial pain: Secondary | ICD-10-CM | POA: Diagnosis not present

## 2022-11-27 DIAGNOSIS — R079 Chest pain, unspecified: Secondary | ICD-10-CM

## 2022-11-27 NOTE — Patient Instructions (Signed)
Medication Instructions:  Your physician recommends that you continue on your current medications as directed. Please refer to the Current Medication list given to you today.  *If you need a refill on your cardiac medications before your next appointment, please call your pharmacy*  Lab Work: - None ordered  Testing/Procedures: - None ordered  Follow-Up: At [facility], you and your health needs are our priority.  As part of our continuing mission to provide you with exceptional heart care, we have created designated Provider Care Teams.  These Care Teams include your primary Cardiologist (physician) and Advanced Practice Providers (APPs -  Physician Assistants and Nurse Practitioners) who all work together to provide you with the care you need, when you need it.  Your next appointment:   Follow-up as needed   Provider:   You may see Debbe Odea, MD or one of the following Advanced Practice Providers on your designated Care Team:   Nicolasa Ducking, NP Eula Listen, PA-C Cadence Fransico Michael, PA-C Charlsie Quest, NP Carlos Levering, NP    Other Instructions - None

## 2022-11-27 NOTE — Progress Notes (Signed)
Cardiology Office Note:    Date:  11/27/2022   ID:  Hector Mercer, DOB 23-Sep-1980, MRN [id number]  PCP:  Jerrol Banana, MD   [facility] Providers Cardiologist:  Debbe Odea, MD     Referring MD: Jerrol Banana, MD   Chief Complaint  Patient presents with   Follow-up    9 month follow, mediation reviewed verbally with patient    History of Present Illness:    Hector Mercer is a 42 y.o. male with a hx of asthma, current smoker x16+ years who presents for follow-up.  He was previously seen due to chest pain deemed noncardiac.  Has not had any symptoms since last visit which is over a year now.  He still smokes.  Fairly active, denies chest pain or shortness of breath.  Working on quitting smoking.  Denies any concerns at this time.   Past Medical History:  Diagnosis Date   Asthma    Seasonal allergies    Shoulder dislocation, left, initial encounter     Past Surgical History:  Procedure Laterality Date   WISDOM TOOTH EXTRACTION Bilateral 2003   WISDOM TOOTH EXTRACTION Bilateral 2021    Current Medications: Current Meds  Medication Sig   albuterol (VENTOLIN HFA) 108 (90 Base) MCG/ACT inhaler Inhale 2 puffs into the lungs every 6 (six) hours as needed for wheezing or shortness of breath.   hydrOXYzine (VISTARIL) 25 MG capsule Take 1 capsule (25 mg total) by mouth every 8 (eight) hours as needed for anxiety.     Allergies:   Patient has no known allergies.   Social History   Socioeconomic History   Marital status: Married    Spouse name: Eliazer Hemphill   Number of children: 8   Years of education: 12+   Highest education level: Some college, no degree  Occupational History   Not on file  Tobacco Use   Smoking status: Some Days    Types: Cigars   Smokeless tobacco: Former    Types: Engineer, drilling   Vaping status: Every Day   Substances: Nicotine, Flavoring  Substance and Sexual Activity   Alcohol use: Yes    Alcohol/week: 20.0 standard  drinks of alcohol    Types: 20 Cans of beer per week   Drug use: Not Currently    Comment: CBD   Sexual activity: Yes    Partners: Female  Other Topics Concern   Not on file  Social History Narrative   Not on file   Social Determinants of Health   Financial Resource Strain: Not on file  Food Insecurity: Not on file  Transportation Needs: Not on file  Physical Activity: Not on file  Stress: Not on file  Social Connections: Not on file     Family History: The patient's family history includes Dementia in his maternal grandmother; Diabetes in his father and mother; Heart failure in his mother; Hypertension in his mother; Kidney disease in his mother; Lung cancer in his mother; Prostate cancer in his paternal uncle.  ROS:   Please see the history of present illness.     All other systems reviewed and are negative.  EKGs/Labs/Other Studies Reviewed:    The following studies were reviewed today:  EKG Interpretation Date/Time:  Friday November 27 2022 08:54:01 EDT Ventricular Rate:  73 PR Interval:  202 QRS Duration:  110 QT Interval:  364 QTC Calculation: 401 R Axis:   -55  Text Interpretation: Normal sinus rhythm Incomplete right bundle branch block Left  anterior fascicular block Confirmed by Debbe Odea ([id number]) on 11/27/2022 8:55:25 AM    Recent Labs: No results found for requested labs within last 365 days.  Recent Lipid Panel No results found for: "CHOL", "TRIG", "HDL", "CHOLHDL", "VLDL", "LDLCALC", "LDLDIRECT"   Risk Assessment/Calculations:          Physical Exam:    VS:  BP 110/78 (BP Location: Left Arm, Patient Position: Sitting, Cuff Size: Normal)   Pulse 73   Ht 5\' 11"  (1.803 m)   Wt 219 lb (99.3 kg)   SpO2 98%   BMI 30.54 kg/m     Wt Readings from Last 3 Encounters:  11/27/22 219 lb (99.3 kg)  08/06/21 235 lb (106.6 kg)  05/23/21 234 lb (106.1 kg)     GEN:  Well nourished, well developed in no acute distress HEENT: Normal NECK: No  JVD; No carotid bruits CARDIAC: RRR, no murmurs, rubs, gallops RESPIRATORY:  Clear to auscultation without rales, wheezing or rhonchi  ABDOMEN: Soft, non-tender, non-distended MUSCULOSKELETAL:  No edema; No deformity  SKIN: Warm and dry NEUROLOGIC:  Alert and oriented x 3 PSYCHIATRIC:  Normal affect   ASSESSMENT:    1. Chest pain of uncertain etiology   2. Precordial pain    PLAN:    In order of problems listed above:  Chest pain, risk factors smoking.  No episodes over the past year.  Echo 06/2021 normal systolic function EF 55 to 60%. Current smoker, cessation again strongly advised.  Follow-up as needed      Medication Adjustments/Labs and Tests Ordered: Current medicines are reviewed at length with the patient today.  Concerns regarding medicines are outlined above.  Orders Placed This Encounter  Procedures   EKG 12-Lead   No orders of the defined types were placed in this encounter.   Patient Instructions  Medication Instructions:  Your physician recommends that you continue on your current medications as directed. Please refer to the Current Medication list given to you today.  *If you need a refill on your cardiac medications before your next appointment, please call your pharmacy*  Lab Work: - None ordered  Testing/Procedures: - None ordered  Follow-Up: At [facility], you and your health needs are our priority.  As part of our continuing mission to provide you with exceptional heart care, we have created designated Provider Care Teams.  These Care Teams include your primary Cardiologist (physician) and Advanced Practice Providers (APPs -  Physician Assistants and Nurse Practitioners) who all work together to provide you with the care you need, when you need it.  Your next appointment:   Follow-up as needed   Provider:   You may see Debbe Odea, MD or one of the following Advanced Practice Providers on your designated Care Team:   Nicolasa Ducking, NP Eula Listen, PA-C Cadence Fransico Michael, PA-C Charlsie Quest, NP Carlos Levering, NP    Other Instructions - None    Signed, Debbe Odea, MD  11/27/2022 9:47 AM    [facility]

## 2023-02-12 ENCOUNTER — Encounter: Payer: Self-pay | Admitting: Family Medicine

## 2023-02-12 ENCOUNTER — Ambulatory Visit (INDEPENDENT_AMBULATORY_CARE_PROVIDER_SITE_OTHER): Payer: BC Managed Care – PPO | Admitting: Family Medicine

## 2023-02-12 VITALS — BP 112/82 | HR 96 | Ht 71.0 in | Wt 210.2 lb

## 2023-02-12 DIAGNOSIS — K5904 Chronic idiopathic constipation: Secondary | ICD-10-CM | POA: Diagnosis not present

## 2023-02-12 MED ORDER — ONDANSETRON 8 MG PO TBDP: 8.0000 mg | ORAL_TABLET | Freq: Three times a day (TID) | ORAL | 1 refills | Status: AC | PRN

## 2023-02-12 NOTE — Progress Notes (Signed)
     Primary Care / Sports Medicine Office Visit  Patient Information:  Patient ID: Hector Mercer, male DOB: Jun 12, 1980 Age: 43 y.o. MRN: [id number]   Hector Mercer is a pleasant 43 y.o. male presenting with the following:  Chief Complaint  Patient presents with   Constipation    Patient has not been able to digest food for the past 8 days. Patient has had 3 BM since last Thursday. Having issue going to the bathroom after eating beef.     Vitals:   02/12/23 1035  BP: 112/82  Pulse: 96  SpO2: 98%   Vitals:   02/12/23 1035  Weight: 210 lb 3.2 oz (95.3 kg)  Height: 5\' 11"  (1.803 m)   Body mass index is 29.32 kg/m.  No results found.   Independent interpretation of notes and tests performed by another provider:   None  Procedures performed:   None  Pertinent History, Exam, Impression, and Recommendations:   Problem List Items Addressed This Visit     Chronic idiopathic constipation - Primary   History of Present Illness Mr. Roper, a patient with a past medical history of gastrointestinal issues including possible mild ulcerative colitis (relayed by patient from endoscopy from his 54's), presents with a recent episode of vomiting and inability to keep down food except for Cheerios. He reports a change in bowel movements, with stools becoming looser and then stopping completely for five days. He has been experiencing abdominal pain and back pain, which he believes is related to his gastrointestinal issues. He has made dietary changes, including cutting out alcohol and dairy, except for cheese. He has also intentionally lost significant weight over the past two years, dropping from 260 lbs to 210 lbs.  Physical Exam MEASUREMENTS: WT- 210 ABDOMEN: Hypoactive bowel sounds, nontender diffusely without hepatosplenomegaly.  Assessment and Plan Gastrointestinal Distress Recent onset of vomiting and constipation following a meal. This is in the setting of possible history of  ulcerative colitis. No current signs of severe dehydration or obstruction. -Prescribe Zofran for nausea control. -Advise clear liquid diet to reduce digestive workload. -Recommend over-the-counter stool softener (Colace) 1-3 times daily and osmotic laxative (Milk of Magnesia) as needed. -Advise use of Fleet enema if no bowel movement within 24 hours of initiating treatment. -Encourage hydration to prevent further constipation and dehydration. -Schedule follow-up appointment in one month to reassess symptoms and potentially conduct blood work.  Possible History of Ulcerative Colitis Patient reports history of "mild ulcerative colitis" in his 28s. Current symptoms may suggest a flare-up. -Advise patient to obtain old medical records for review. -Plan for GI referral given history and current symptoms, opted to discuss at his return in 1 month for physical.        Orders & Medications Medications:  Meds ordered this encounter  Medications   ondansetron (ZOFRAN-ODT) 8 MG disintegrating tablet    Sig: Take 1 tablet (8 mg total) by mouth every 8 (eight) hours as needed for nausea.    Dispense:  20 tablet    Refill:  1   No orders of the defined types were placed in this encounter.    Return in about 4 weeks (around 03/12/2023) for CPE.     Jerrol Banana, MD, [facility]   Primary Care Sports Medicine Primary Care and Sports Medicine at [facility]

## 2023-02-12 NOTE — Assessment & Plan Note (Signed)
History of Present Illness Hector Mercer, a patient with a past medical history of gastrointestinal issues including possible mild ulcerative colitis (relayed by patient from endoscopy from his 64's), presents with a recent episode of vomiting and inability to keep down food except for Cheerios. He reports a change in bowel movements, with stools becoming looser and then stopping completely for five days. He has been experiencing abdominal pain and back pain, which he believes is related to his gastrointestinal issues. He has made dietary changes, including cutting out alcohol and dairy, except for cheese. He has also intentionally lost significant weight over the past two years, dropping from 260 lbs to 210 lbs.  Physical Exam MEASUREMENTS: WT- 210 ABDOMEN: Hypoactive bowel sounds, nontender diffusely without hepatosplenomegaly.  Assessment and Plan Gastrointestinal Distress Recent onset of vomiting and constipation following a meal. This is in the setting of possible history of ulcerative colitis. No current signs of severe dehydration or obstruction. -Prescribe Zofran for nausea control. -Advise clear liquid diet to reduce digestive workload. -Recommend over-the-counter stool softener (Colace) 1-3 times daily and osmotic laxative (Milk of Magnesia) as needed. -Advise use of Fleet enema if no bowel movement within 24 hours of initiating treatment. -Encourage hydration to prevent further constipation and dehydration. -Schedule follow-up appointment in one month to reassess symptoms and potentially conduct blood work.  Possible History of Ulcerative Colitis Patient reports history of "mild ulcerative colitis" in his 9s. Current symptoms may suggest a flare-up. -Advise patient to obtain old medical records for review. -Plan for GI referral given history and current symptoms, opted to discuss at his return in 1 month for physical.

## 2023-02-12 NOTE — Patient Instructions (Signed)
Your Care Plan:  - Gastrointestinal Distress:   - I have prescribed Zofran to control nausea.   - Follow a clear liquid diet to reduce the workload on your digestive system.   - Use an over-the-counter stool softener like Colace 1-3 times daily.   - Use an osmotic laxative like Milk of Magnesia as needed.   - If you do not have a bowel movement within 24 hours of starting this treatment, use a Fleet enema.   - Stay hydrated to prevent further constipation and dehydration.  - Possible History of Ulcerative Colitis:   - Given your history and current symptoms, it is important to review your old medical records, please obtain and send these to [place].  - General Health Maintenance:   - We will plan for a routine physical examination in one month, which will include blood work to monitor your overall health.

## 2023-03-12 ENCOUNTER — Ambulatory Visit: Payer: Self-pay | Admitting: Family Medicine

## 2023-04-02 ENCOUNTER — Ambulatory Visit: Payer: BC Managed Care – PPO | Admitting: Family Medicine

## 2023-06-07 IMAGING — CR DG CHEST 2V
3 series · 3 of 3 positions shown · non-contrast
Comparison: None.

CLINICAL DATA: Chest pain

EXAM:
CHEST - 2 VIEW

[chest pa]
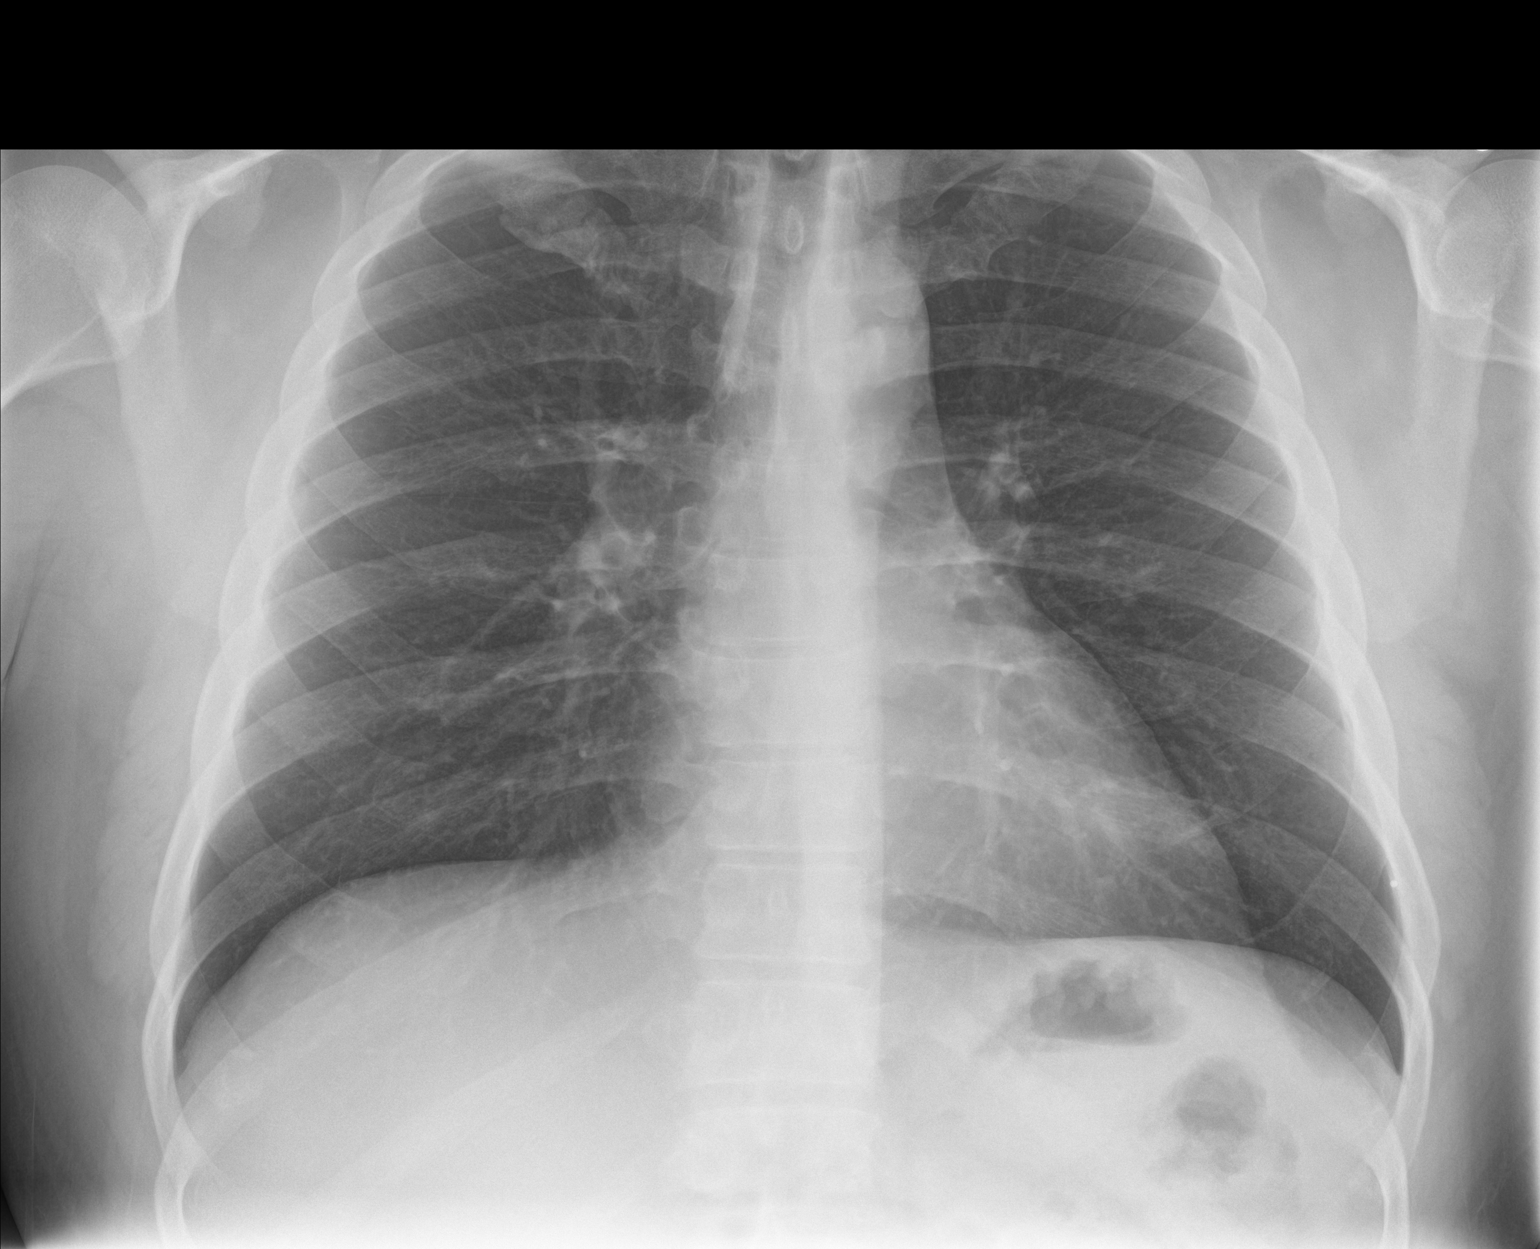

[chest lat (1 of 2)]
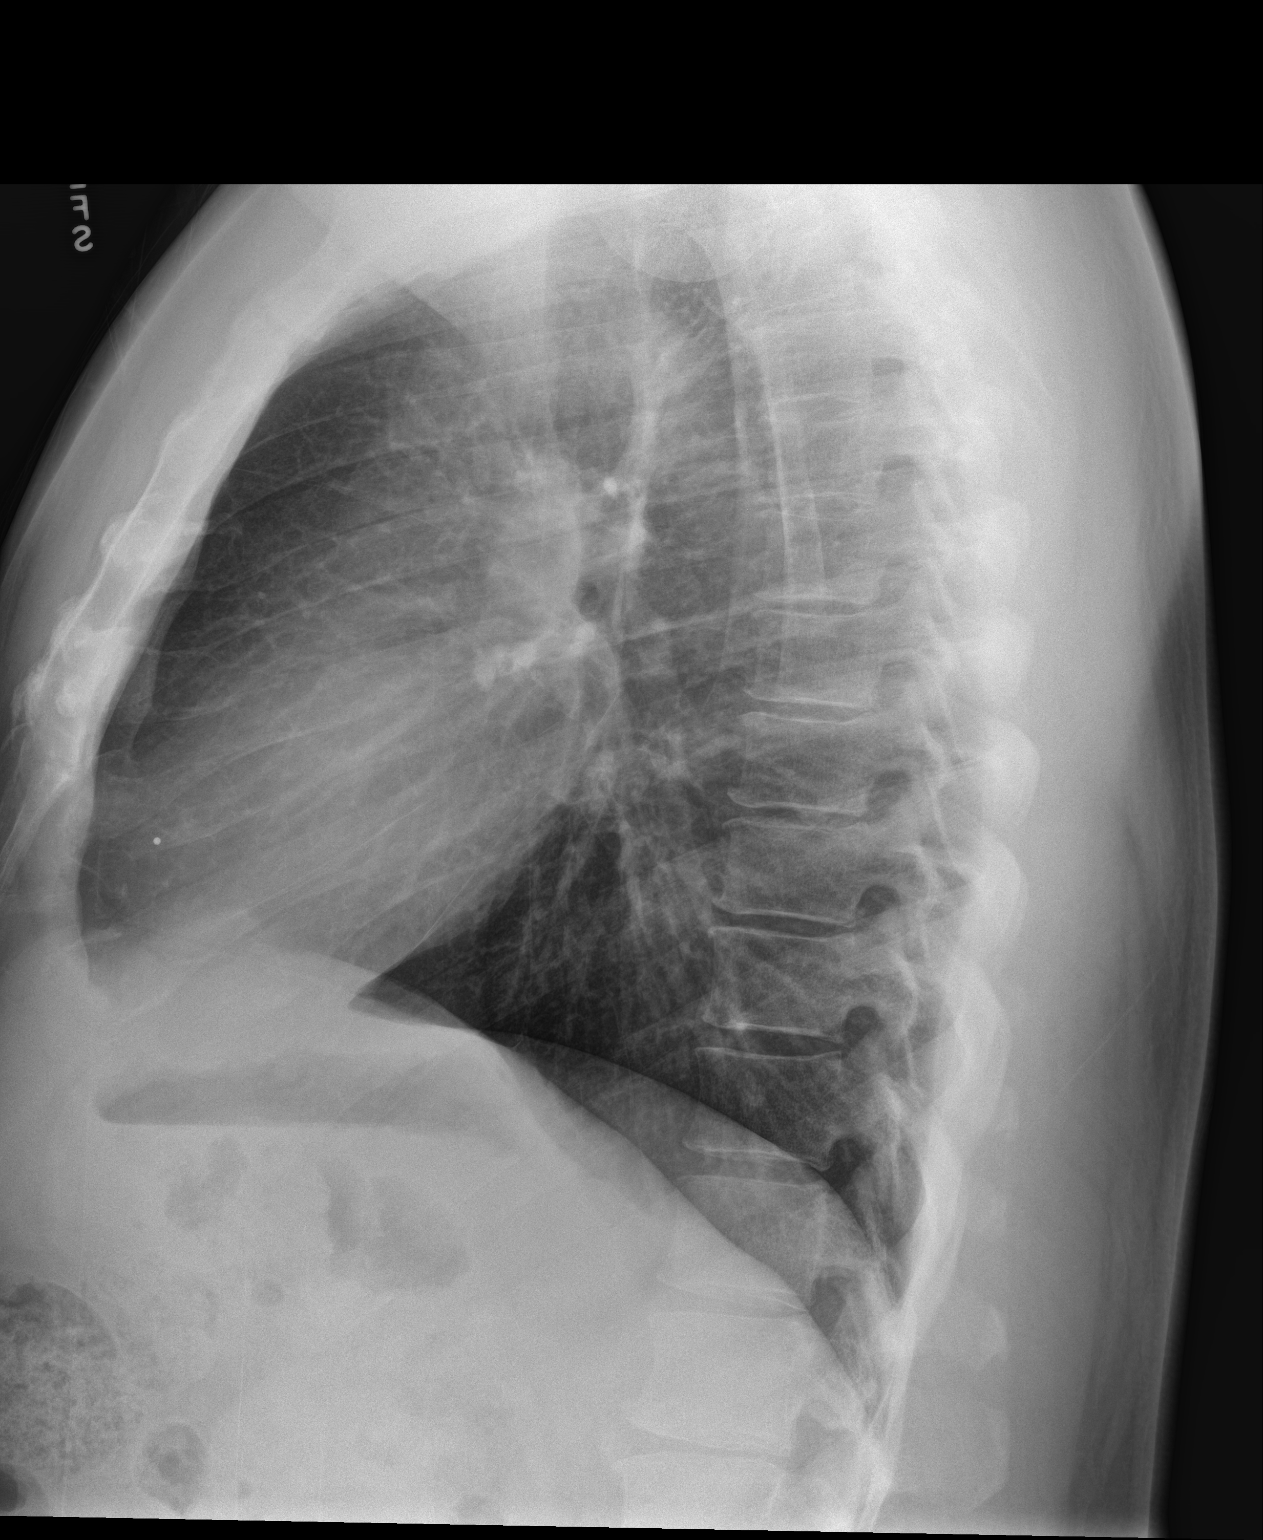

[chest lat (2 of 2)]
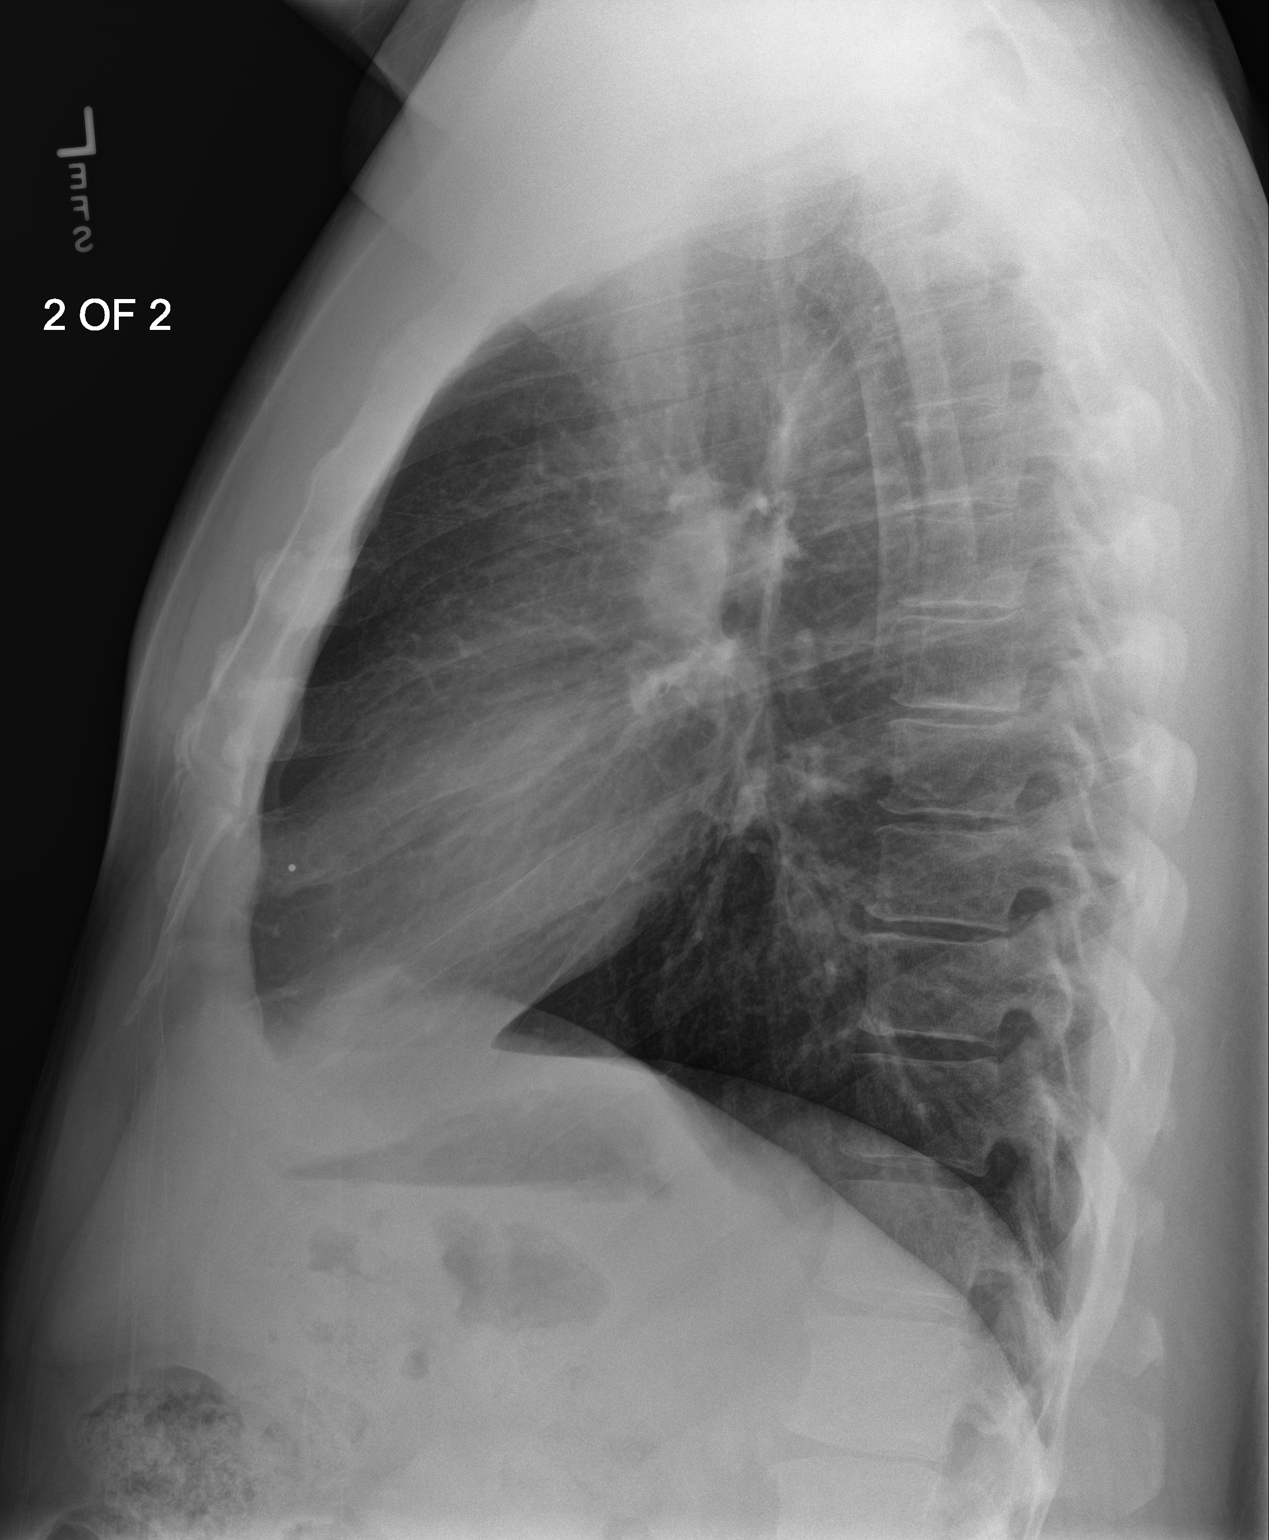

[3 of 3 positions shown; findings below may reference images not displayed]

FINDINGS: The cardiomediastinal silhouette is normal.

There is no focal consolidation or pulmonary edema. There is no
pleural effusion or pneumothorax.

There is no acute osseous abnormality. There is no displaced rib
fracture or other acute finding at the indicated site of pain.
IMPRESSION: No radiographic evidence of acute cardiopulmonary process. No
displaced rib fracture identified.

## 2023-11-25 ENCOUNTER — Ambulatory Visit: Payer: Self-pay

## 2023-11-25 NOTE — Telephone Encounter (Signed)
 FYI Only or Action Required?: Action required by provider: requests for son to establish care with pcp Dr. Estil .  Patient was last seen in primary care on 02/12/2023 by Alvia Selinda PARAS, MD.  Called Nurse Triage reporting request for son to establish care with Dr. Estil .  Symptoms began n/a.  Interventions attempted: Other: n/a.  Symptoms are: n/a.  Triage Disposition: Call PCP Within 24 Hours  Patient/caregiver understands and will follow disposition?: No, wishes to speak with PCP    Reason for Disposition  [1] Caller requests to speak ONLY to PCP AND [2] NON-URGENT question  Answer Assessment - Initial Assessment Questions 1. REASON FOR CALL or QUESTION: What is your reason for calling today? or How can I best    Patient is requesting for Dr. Estil to accept his 9 year old son as a new primary care patient. He states he was told some providers will accept family members of establish patients. Called to [facility] and was advised to send crm for Dr. Donnice to review. Please note patient states he is not able to access MyChart at this time and would like a call back.  Protocols used: PCP Call - No Triage-A-AH

## 2023-11-26 NOTE — Telephone Encounter (Signed)
 Tried to call patient to inform him Dr. Alvia does not see Pediatric patients for Primary care. He does not have a VM that is set  up. Will try and call back later.  JM

## 2023-11-29 NOTE — Telephone Encounter (Signed)
 Copied from CRM [id number]. Topic: Appointments - Scheduling Inquiry for Clinic >> Nov 29, 2023 11:37 AM Kendralyn S wrote: Reason for CRM: Hector Mercer called today inquiring about his son being seen by selinda ku, he stated some confusion with being told two different answers about if his 43 year old can be seen by dr ku. I informed him that dr only sees that age for sports medicine not for primary care and Man stated that as a kid he saw a sports med doctor who managed his asthma. Please advise pt
